# Patient Record
Sex: Male | Born: 1995 | Race: Black or African American | Hispanic: No | Marital: Single | Smoking: Never smoker
Health system: Southern US, Community
[De-identification: ages and names within clinical notes are randomized; demographics above are authoritative.]

---
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
## 2016-06-21 ENCOUNTER — Encounter: Payer: Self-pay | Admitting: Student

## 2016-06-21 ENCOUNTER — Ambulatory Visit (INDEPENDENT_AMBULATORY_CARE_PROVIDER_SITE_OTHER): Payer: Medicaid Other | Admitting: Student

## 2016-06-21 VITALS — BP 132/53 | HR 56 | Temp 97.9°F | Ht 67.25 in | Wt 120.0 lb

## 2016-06-21 DIAGNOSIS — Z7251 High risk heterosexual behavior: Secondary | ICD-10-CM

## 2016-06-21 DIAGNOSIS — Z23 Encounter for immunization: Secondary | ICD-10-CM | POA: Diagnosis not present

## 2016-06-21 NOTE — Progress Notes (Signed)
Subjective:     History was provided by the mother and patietn  James Humphrey is a 20 y.o. male who is here for this wellness visit.    Current Issues: Current concerns include:stomach pain Stomach pain: this is chronic issue since he was a baby. Pain is over his upper quadrant on both side. Cramping in nature. No association with meals. Denies diarrhea. Denies steatorrhea. Weight is stable. No family history of IBD or colon cancer.  H (Home) Family Relationships: good Communication: good with parents Responsibilities: has responsibilities at home  E (Education): Grades/School: graduated from high school about 5 months ago. Likes to have a job to earn money. Future Plans: Likes to have a job to earn money  A (Activities) Sports: no sports. Basketball and football in the past. Still play sometimes Exercise: walking Activities: > 2 hrs TV/computer for > 3 hours Friends: Yes   A (Auton/Safety) Auto: wears seat belt Bike: doesn't wear bike helmet.  Safety: can swim  D (Diet) Diet: at home and outside. Vegetables and fruits Risky eating habits: mother states that he doesn't eat as usual. She thinks his appetite is not good. she thinks he is the same weight wise Intake: low fat diet and adequate iron and calcium intake Body Image: positive body image  The following information was obtained with the mother removed from the room.  Drugs Tobacco: No Alcohol: No Drugs: No  Sex Activity: safe sex. One male partner now. Reports having 4 sexual partners in the last 12 months. Reports consistent use of condoms except with once about a year ago. Denies history of penile lesion, discharge or swelling in groin areas.   Suicide Risk Emotions: healthy. Reports intermittent stress from not working and earning something. Likes to find a job and earn money. Depression: denies feelings of depression Suicidal: denies suicidal ideation     Objective:     Vitals:   06/21/16 1617   BP: (!) 132/53  Pulse: (!) 56  Temp: 97.9 F (36.6 C)  TempSrc: Oral  Weight: 120 lb (54.4 kg)  Height: 5' 7.25" (1.708 m)   Growth parameters are noted and are appropriate for age.  General:   alert, cooperative and appears stated age  Gait:   normal  Skin:   normal  Oral cavity:   lips, mucosa, and tongue normal; teeth and gums normal  Eyes:   sclerae white, pupils equal and reactive  Ears:   normal bilaterally  Neck:   normal  Lungs:  clear to auscultation bilaterally  Heart:   regular rate and rhythm, S1, S2 normal, no murmur, click, rub or gallop  Abdomen:  soft, non-tender; bowel sounds normal; no masses,  no organomegaly  GU:  not examined  Extremities:   extremities normal, atraumatic, no cyanosis or edema  Neuro:  normal without focal findings, mental status, speech normal, alert and oriented x3, PERLA, muscle tone and strength normal and symmetric, reflexes normal and symmetric and sensation grossly normal     Assessment:    Healthy 20 y.o. male with normal exam. Plan:   1. Anticipatory guidance discussed. Physical activity and Safety, particularly about TV time and being helmet when he rides a bike. I have also encouraged him to come back and see me if he would like to discuss about stress.   He is interested in STI screening. However, lab was closed by the time he was seen. I advised him to call and schedule a lab visits whenever he can. I  have not discussed about this with his mother.   He received his flu shot today.   2. Follow-up visit in 12 months for next wellness visit, or sooner as needed.

## 2016-06-21 NOTE — Patient Instructions (Addendum)
It was great seeing you today! Your exam is normal. I would be happy to see you again if you have any concerns you will like to discuss with me.     If we did any lab work today, and the results require attention, either me or my nurse will get in touch with you. If everything is normal, you will get a letter in mail. If you don't hear from us in two weeks, please give us a call. Otherwise, we look forward to seeing you again at your next visit. If you have any questions or concerns before then, please call the clinic at [phone].   Please bring all your medications to every doctors visit   Sign up for My Chart to have easy access to your labs results, and communication with your Primary care physician.     Please check-out at the front desk before leaving the clinic.    Take Care,

## 2018-04-08 ENCOUNTER — Ambulatory Visit (INDEPENDENT_AMBULATORY_CARE_PROVIDER_SITE_OTHER): Payer: Medicaid Other | Admitting: Family Medicine

## 2018-04-08 ENCOUNTER — Other Ambulatory Visit: Payer: Self-pay

## 2018-04-08 ENCOUNTER — Other Ambulatory Visit (HOSPITAL_COMMUNITY)
Admission: RE | Admit: 2018-04-08 | Discharge: 2018-04-08 | Disposition: A | Discharge status: Home / Self Care | Payer: Medicaid Other | Source: Ambulatory Visit | Attending: Family Medicine | Admitting: Family Medicine

## 2018-04-08 ENCOUNTER — Encounter: Payer: Self-pay | Admitting: Family Medicine

## 2018-04-08 VITALS — BP 112/60 | HR 55 | Temp 97.6°F | Ht 67.5 in | Wt 119.0 lb

## 2018-04-08 DIAGNOSIS — Z113 Encounter for screening for infections with a predominantly sexual mode of transmission: Secondary | ICD-10-CM | POA: Diagnosis present

## 2018-04-08 NOTE — Patient Instructions (Signed)
It was a pleasure to see you today! Thank you for choosing [facility] for your primary care. James Humphrey was seen for a physical and STD screen. Come back to the clinic if you have any routine needs, and go to the emergency room if you have any life threatening symptoms.  I will call you with the results from your tests immediately if we find an infection and after all results are in if we don't find an infection.   Otherwise, we look forward to seeing you again at your next visit. If you have any questions or concerns before then, please call the clinic at [phone].   Please bring all your medications to every doctors visit   Sign up for My Chart to have easy access to your labs results, and communication with your Primary care physician.     Please check-out at the front desk before leaving the clinic.     Best,  Dr. Marthenia RollingScott Jonathin Heinicke FAMILY MEDICINE RESIDENT - PGY2 04/08/2018 9:40 AM

## 2018-04-09 DIAGNOSIS — Z113 Encounter for screening for infections with a predominantly sexual mode of transmission: Secondary | ICD-10-CM | POA: Insufficient documentation

## 2018-04-09 LAB — HIV ANTIBODY (ROUTINE TESTING W REFLEX): HIV Screen 4th Generation wRfx: NONREACTIVE

## 2018-04-09 LAB — URINE CYTOLOGY ANCILLARY ONLY
CHLAMYDIA, DNA PROBE: POSITIVE — AB
Neisseria Gonorrhea: NEGATIVE
Trichomonas: NEGATIVE

## 2018-04-09 NOTE — Progress Notes (Signed)
    Subjective:  James Humphrey is a 22 y.o. male who [facility] with a chief complaint of "check up".   HPI: Patient described no chronic meds and no physical complaints/symptoms.  His only request is an STD screen as he is sexually active.  He says he [name] uses condoms now but "wants to be sure".  He has no urinary symtpoms and no rashes/lesions that he is aware of, he also denies any knowledge of positive results from partners.   Objective:  Physical Exam: BP 112/60   Pulse (!) 55   Temp 97.6 F (36.4 C) (Oral)   Ht 5' 7.5" (1.715 m)   Wt 119 lb (54 kg)   SpO2 99%   BMI 18.36 kg/m   Gen: NAD, resting comfortably, athletic CV: RRR with no murmurs appreciated Pulm: NWOB, CTAB with no crackles, wheezes, or rhonchi GI:  Soft, Nontender, Nondistended. MSK: no edema, cyanosis, or clubbing noted Skin: warm, dry GU exam declined Neuro: grossly normal, moves all extremities Psych: Normal affect and thought content  Results for orders placed or performed in visit on 04/08/18 (from the past 72 hour(s))  HIV antibody (with reflex)     Status: None   Collection Time: 04/08/18  9:52 AM  Result Value Ref Range   HIV Screen 4th Generation wRfx Non Reactive Non Reactive     Assessment/Plan:  Screen for STD (sexually transmitted disease) His only request is an STD screen as he is sexually active.  He says he [name] uses condoms now but "wants to be sure".  He has no urinary symtpoms and no rashes/lesions that he is aware of, he also denies any knowledge of positive results from partners.   He would like HIV and urine cytology for trich/gon/chlam but declines RPR, he consents to results left on his phone recorder   Marthenia RollingScott Emryn Flanery, DO FAMILY MEDICINE RESIDENT - PGY2 04/09/2018 9:59 AM

## 2018-04-09 NOTE — Assessment & Plan Note (Addendum)
His only request is an STD screen as he is sexually active.  He says he [name] uses condoms now but "wants to be sure".  He has no urinary symtpoms and no rashes/lesions that he is aware of, he also denies any knowledge of positive results from partners.   He would like HIV and urine cytology for trich/gon/chlam but declines RPR, he consents to results left on his phone recorder

## 2018-04-10 ENCOUNTER — Telehealth: Payer: Self-pay | Admitting: Family Medicine

## 2018-04-10 DIAGNOSIS — A749 Chlamydial infection, unspecified: Secondary | ICD-10-CM

## 2018-04-10 MED ORDER — AZITHROMYCIN 250 MG PO TABS: ORAL_TABLET | ORAL | 0 refills | Status: AC

## 2018-04-10 NOTE — Telephone Encounter (Signed)
Called patient but his mother picked up, I did not disclose reason for call but if patient calls back, confirm identity then tell him:   "Dr. Parke SimmersBland tried to reach you to let you know your test was positive for chlamydia.  This can be treated by taking 4 pills at once and he has already sent that prescription to the pharmacy.  Any recent sexual partners should also be told that they need to be tested and/or treated and we can do that here at the clinic if they need.  Do not have sex for 7 days after treatment.  We did not tell your family member the reason for the call"  -Dr. Parke SimmersBland

## 2018-04-14 ENCOUNTER — Telehealth: Payer: Self-pay | Admitting: Family Medicine

## 2018-04-14 NOTE — Telephone Encounter (Signed)
Reached patient by phone to tell him abotu positive chlamydia, need to pick up abx, refrain from sexual activity for 7 days and to notify sexual partners.  Patient said we had his mother's phone number as primary contact by mistake so I changed that  -Dr. Parke SimmersBland

## 2018-04-18 ENCOUNTER — Encounter: Payer: Self-pay | Admitting: Family Medicine
